# Patient Record
Sex: Male | Born: 1975 | Race: Black or African American | Hispanic: No | Marital: Single | Smoking: Never smoker
Health system: Southern US, Community
[De-identification: ages and names within clinical notes are randomized; demographics above are authoritative.]

---

## 2007-04-17 ENCOUNTER — Emergency Department (HOSPITAL_COMMUNITY): Admission: EM | Admit: 2007-04-17 | Discharge: 2007-04-17 | Payer: Self-pay | Admitting: Emergency Medicine

## 2008-10-29 ENCOUNTER — Emergency Department (HOSPITAL_BASED_OUTPATIENT_CLINIC_OR_DEPARTMENT_OTHER): Admission: EM | Admit: 2008-10-29 | Discharge: 2008-10-29 | Payer: Self-pay | Admitting: Emergency Medicine

## 2008-12-28 ENCOUNTER — Emergency Department (HOSPITAL_COMMUNITY): Admission: EM | Admit: 2008-12-28 | Discharge: 2008-12-28 | Payer: Self-pay | Admitting: Emergency Medicine

## 2009-05-17 ENCOUNTER — Emergency Department (HOSPITAL_BASED_OUTPATIENT_CLINIC_OR_DEPARTMENT_OTHER): Admission: EM | Admit: 2009-05-17 | Discharge: 2009-05-17 | Payer: Self-pay | Admitting: Emergency Medicine

## 2010-07-04 ENCOUNTER — Emergency Department (HOSPITAL_BASED_OUTPATIENT_CLINIC_OR_DEPARTMENT_OTHER): Admission: EM | Admit: 2010-07-04 | Discharge: 2010-07-04 | Payer: Self-pay | Admitting: Emergency Medicine

## 2010-07-04 ENCOUNTER — Ambulatory Visit: Payer: Self-pay | Admitting: Diagnostic Radiology

## 2011-01-10 LAB — WOUND CULTURE
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
## 2013-08-14 ENCOUNTER — Emergency Department (HOSPITAL_BASED_OUTPATIENT_CLINIC_OR_DEPARTMENT_OTHER)
Admission: EM | Admit: 2013-08-14 | Discharge: 2013-08-14 | Disposition: A | Discharge status: Home / Self Care | Payer: Self-pay | Attending: Emergency Medicine | Admitting: Emergency Medicine

## 2013-08-14 ENCOUNTER — Encounter (HOSPITAL_BASED_OUTPATIENT_CLINIC_OR_DEPARTMENT_OTHER): Payer: Self-pay | Admitting: Emergency Medicine

## 2013-08-14 DIAGNOSIS — L02419 Cutaneous abscess of limb, unspecified: Secondary | ICD-10-CM | POA: Insufficient documentation

## 2013-08-14 DIAGNOSIS — L0390 Cellulitis, unspecified: Secondary | ICD-10-CM

## 2013-08-14 DIAGNOSIS — Z87891 Personal history of nicotine dependence: Secondary | ICD-10-CM | POA: Insufficient documentation

## 2013-08-14 MED ORDER — CLINDAMYCIN HCL 150 MG PO CAPS: 450.0000 mg | ORAL_CAPSULE | Freq: Three times a day (TID) | ORAL | Status: AC

## 2013-08-14 MED ORDER — CLINDAMYCIN HCL 150 MG PO CAPS
450.0000 mg | ORAL_CAPSULE | Freq: Once | ORAL | Status: AC
  Administered 2013-08-14: 450 mg via ORAL
  Filled 2013-08-14: qty 3

## 2013-08-14 NOTE — ED Provider Notes (Signed)
CSN: [id number]     Arrival date & time 08/14/13  4098 History   First MD Initiated Contact with Patient 08/14/13 [phone]     Chief Complaint  Patient presents with  . Recurrent Skin Infections   (Consider location/radiation/quality/duration/timing/severity/associated sxs/prior Treatment) HPI  37 year old male here with the redness and pain and assumed infectious sight on his right leg for 2 days. He describes the lesion started 2 days ago after he popped a pimple. After that he began to develop pain that was followed by warmth and redness. Its progressively worsened since then prompting his coming to the ED today. He denies nausea, vomiting, fever, chills, sweats, and any pain except for localized pain. His pain is sharp and constant exacerbated by touch and pressure without any alleviating factors.    History reviewed. No pertinent past medical history. History reviewed. No pertinent past surgical history. History reviewed. No pertinent family history. History  Substance Use Topics  . Smoking status: Former Games developer  . Smokeless tobacco: Not on file  . Alcohol Use: Not on file    Review of Systems  Constitutional: Negative for fever, chills and diaphoresis.  HENT: Negative for sore throat.   Eyes: Negative for visual disturbance.  Respiratory: Negative for cough and shortness of breath.   Gastrointestinal: Negative for nausea, vomiting, abdominal pain and diarrhea.  Genitourinary: Negative for dysuria.  Musculoskeletal: Negative for back pain.  Skin: Negative for rash.  Neurological: Negative for headaches.    Allergies  Review of patient's allergies indicates no known allergies.  Home Medications   Current Outpatient Rx  Name  Route  Sig  Dispense  Refill  . clindamycin (CLEOCIN) 150 MG capsule   Oral   Take 3 capsules (450 mg total) by mouth 3 (three) times daily.   39 capsule   0    BP 139/71  Pulse 90  Temp(Src) 98.6 F (37 C) (Oral)  Resp 18  Ht 5\' 11"  (1.803  m)  Wt 250 lb (113.399 kg)  BMI 34.88 kg/m2  SpO2 99% Physical Exam  Nursing note and vitals reviewed. Constitutional: He is oriented to person, place, and time. He appears well-developed and well-nourished. No distress.  HENT:  Head: Normocephalic and atraumatic.  Eyes: EOM are normal. Pupils are equal, round, and reactive to light.  Neck: Normal range of motion. Neck supple.  Cardiovascular: Normal rate, regular rhythm and normal heart sounds.   Pulmonary/Chest: Effort normal and breath sounds normal. No respiratory distress. He has no wheezes.  Abdominal: Soft. Bowel sounds are normal. There is no tenderness.  Musculoskeletal: He exhibits no edema.  Neurological: He is alert and oriented to person, place, and time.  Skin: Skin is warm and dry. He is not diaphoretic.     6 cm by 14 cm area of induration warmth and erythema that's tender to the touch and has a centrally located 3-5 mm flesh colored papule.  No streaking away from the area of induration.   Psychiatric: He has a normal mood and affect.   Bedside US of the affected area performed without a clear pocket of fluid identified.   ED Course  Procedures (including critical care time) Labs Review Labs Reviewed - No data to display Imaging Review No results found.  EKG Interpretation   None       MDM   1. Cellulitis    37 y/o male here with cellulitis. Evaluated with bedside US and no pocket of fluid was identified to I&D. Given first dose of  clindamycin here. Without signs of systemic involvement and normal PO intake will dc home.  Discussed red flags and very low threshold for return, he voices understanding.   Will treat with 7 days of clinda, 450 mg TID Also  Discussed warm compresses TID.   Murtis Sink, MD [facility] Resident, PGY-2 08/14/2013, 9:22 AM       Elenora Gamma, MD 08/14/13 [phone]

## 2013-08-14 NOTE — ED Notes (Signed)
Pt amb to room 1 with quick steady gait in nad. Pt reports 2 days of "boil" to his right thigh area. Area is swollen, warm to touch, red. Denies any fevers.

## 2013-08-23 NOTE — ED Provider Notes (Signed)
I saw and evaluated the patient, reviewed the resident's note and I agree with the findings and plan.   .Face to face Exam:  General:  Awake HEENT:  Atraumatic Resp:  Normal effort Abd:  Nondistended Neuro:No focal weakness  Nelia Shi, MD 08/23/13 0710

## 2014-12-08 ENCOUNTER — Encounter (HOSPITAL_BASED_OUTPATIENT_CLINIC_OR_DEPARTMENT_OTHER): Payer: Self-pay | Admitting: *Deleted

## 2014-12-08 ENCOUNTER — Emergency Department (HOSPITAL_BASED_OUTPATIENT_CLINIC_OR_DEPARTMENT_OTHER)
Admission: EM | Admit: 2014-12-08 | Discharge: 2014-12-08 | Disposition: A | Discharge status: Home / Self Care | Payer: Self-pay | Attending: Emergency Medicine | Admitting: Emergency Medicine

## 2014-12-08 DIAGNOSIS — L03031 Cellulitis of right toe: Secondary | ICD-10-CM | POA: Insufficient documentation

## 2014-12-08 DIAGNOSIS — B351 Tinea unguium: Secondary | ICD-10-CM | POA: Insufficient documentation

## 2014-12-08 DIAGNOSIS — Z792 Long term (current) use of antibiotics: Secondary | ICD-10-CM | POA: Insufficient documentation

## 2014-12-08 MED ORDER — HYDROCODONE-ACETAMINOPHEN 5-325 MG PO TABS: 1.0000 | ORAL_TABLET | ORAL | Status: DC | PRN

## 2014-12-08 MED ORDER — SULFAMETHOXAZOLE-TRIMETHOPRIM 800-160 MG PO TABS: 1.0000 | ORAL_TABLET | Freq: Two times a day (BID) | ORAL | Status: AC

## 2014-12-08 NOTE — ED Notes (Signed)
Patient states he has had a problem with toenail fungus since age 39 yrs.  States last night he developed pain, warmness and pain in his right great toe.

## 2014-12-08 NOTE — Discharge Instructions (Signed)
I recommended that you soak your right foot in warm water 3-4 times a day for 15 minutes at a time. We are discharging you with antibiotics to take to help with this infection. If you notice that redness streaking up your foot or leg, you have fever, began feeling very poorly, vomiting that will not stop, please return to the hospital. We have [facility] to establish care with a primary care provider and also [facility] to see a podiatrist for your chronic fungal infection of your toenails.  You will likely need to be on oral medications to treat your fungal infection that these medications are taken long-term and need close monitoring of your liver function tests therefore this is something a primary care physician or podiatrist should prescribed.   Cellulitis Cellulitis is an infection of the skin and the tissue beneath it. The infected area is usually red and tender. Cellulitis occurs most often in the arms and lower legs.  CAUSES  Cellulitis is caused by bacteria that enter the skin through cracks or cuts in the skin. The most common types of bacteria that cause cellulitis are staphylococci and streptococci. SIGNS AND SYMPTOMS   Redness and warmth.  Swelling.  Tenderness or pain.  Fever. DIAGNOSIS  Your health care provider can usually determine what is wrong based on a physical exam. Blood tests may also be done. TREATMENT  Treatment usually involves taking an antibiotic medicine. HOME CARE INSTRUCTIONS   Take your antibiotic medicine as directed by your health care provider. Finish the antibiotic even if you start to feel better.  Keep the infected arm or leg elevated to reduce swelling.  Apply a warm cloth to the affected area up to 4 times per day to relieve pain.  Take medicines only as directed by your health care provider.  Keep all follow-up visits as directed by your health care provider. SEEK MEDICAL CARE IF:    You notice red streaks coming from the infected area.  Your red area gets larger or turns dark in color.  Your bone or joint underneath the infected area becomes painful after the skin has healed.  Your infection returns in the same area or another area.  You notice a swollen bump in the infected area.  You develop new symptoms.  You have a fever. SEEK IMMEDIATE MEDICAL CARE IF:   You feel very sleepy.  You develop vomiting or diarrhea.  You have a general ill feeling (malaise) with muscle aches and pains. MAKE SURE YOU:   Understand these instructions.  Will watch your condition.  Will get help right away if you are not doing well or get worse. Document Released: 06/21/2005 Document Revised: 01/26/2014 Document Reviewed: 11/27/2011 [facility] Patient Information 2015 [place], [place]. This information is not intended to replace advice given to you by your health care provider. Make sure you discuss any questions you have with your health care provider.  Possible Paronychia Paronychia is an inflammatory reaction involving the folds of the skin surrounding the fingernail. This is commonly caused by an infection in the skin around a nail. The most common cause of paronychia is frequent wetting of the hands (as seen with bartenders, food servers, nurses or others who wet their hands). This makes the skin around the fingernail susceptible to infection by bacteria (germs) or fungus. Other predisposing factors are:  Aggressive manicuring.  Nail biting.  Thumb sucking. The most common cause is a staphylococcal (a type of  germ) infection, or a fungal (Candida) infection. When caused by a germ, it usually comes on suddenly with redness, swelling, pus and is often painful. It may get under the nail and form an abscess (collection of pus), or form an abscess around the nail. If the nail itself is infected with a fungus, the treatment is usually prolonged and may require oral medicine for  up to one year. Your caregiver will determine the length of time treatment is required. The paronychia caused by bacteria (germs) may largely be avoided by not pulling on hangnails or picking at cuticles. When the infection occurs at the tips of the finger it is called felon. When the cause of paronychia is from the herpes simplex virus (HSV) it is called herpetic whitlow. TREATMENT  When an abscess is present treatment is often incision and drainage. This means that the abscess must be cut open so the pus can get out. When this is done, the following home care instructions should be followed. HOME CARE INSTRUCTIONS   It is important to keep the affected fingers very dry. Rubber or plastic gloves over cotton gloves should be used whenever the hand must be placed in water.  Keep wound clean, dry and dressed as suggested by your caregiver between warm soaks or warm compresses.  Soak in warm water for fifteen to twenty minutes three to four times per day for bacterial infections. Fungal infections are very difficult to treat, so often require treatment for long periods of time.  For bacterial (germ) infections take antibiotics (medicine which kill germs) as directed and finish the prescription, even if the problem appears to be solved before the medicine is gone.  Only take over-the-counter or prescription medicines for pain, discomfort, or fever as directed by your caregiver. SEEK IMMEDIATE MEDICAL CARE IF:  You have redness, swelling, or increasing pain in the wound.  You notice pus coming from the wound.  You have a fever.  You notice a bad smell coming from the wound or dressing. Document Released: 03/07/2001 Document Revised: 12/04/2011 Document Reviewed: 11/06/2008 [facility] Patient Information 2015 [place], [place]. This information is not intended to replace advice given to you by your health care provider. Make sure you discuss any questions you have with your health care provider.

## 2014-12-08 NOTE — ED Provider Notes (Signed)
TIME SEEN: 9:30 AM  CHIEF COMPLAINT: Right toe pain  HPI: Pt is a 39 y.o. male with history of toenail fungus since he was 39 years old who presents to the emergency department with swelling, redness and pain to the right great toe. Denies any known injury. States pain started last night. Denies any fevers, chills, nausea, vomiting or diarrhea. No prior history of gout. No numbness, tingling or focal weakness. No drainage. Pain worse with walking. Better with rest. Described as an aching, mild pain without radiation.  ROS: See HPI Constitutional: no fever  Eyes: no drainage  ENT: no runny nose   Cardiovascular:  no chest pain  Resp: no SOB  GI: no vomiting GU: no dysuria Integumentary: no rash  Allergy: no hives  Musculoskeletal: no leg swelling  Neurological: no slurred speech ROS otherwise negative  PAST MEDICAL HISTORY/PAST SURGICAL HISTORY:  History reviewed. No pertinent past medical history.  MEDICATIONS:  Prior to Admission medications   Medication Sig Start Date End Date Taking? Authorizing Provider  clindamycin (CLEOCIN) 150 MG capsule Take 3 capsules (450 mg total) by mouth 3 (three) times daily. 08/14/13   Elenora Gamma, MD    ALLERGIES:  No Known Allergies  SOCIAL HISTORY:  History  Substance Use Topics  . Smoking status: Never Smoker   . Smokeless tobacco: Not on file  . Alcohol Use: Yes     Comment: seldom    FAMILY HISTORY: No family history on file.  EXAM: BP 134/80 mmHg  Pulse 72  Temp(Src) 98 F (36.7 C) (Oral)  Resp 16  Ht  (1.803 m)  Wt 250 lb (113.399 kg)  BMI 34.88 kg/m2  SpO2 100% CONSTITUTIONAL: Alert and oriented and responds appropriately to questions. Well-appearing; well-nourished smiling, laughing, joking, nontoxic HEAD: Normocephalic EYES: Conjunctivae clear, PERRL ENT: normal nose; no rhinorrhea; moist mucous membranes; pharynx without lesions noted NECK: Supple, no meningismus, no LAD  CARD: RRR; S1 and S2  appreciated; no murmurs, no clicks, no rubs, no gallops RESP: Normal chest excursion without splinting or tachypnea; breath sounds clear and equal bilaterally; no wheezes, no rhonchi, no rales ABD/GI: Normal bowel sounds; non-distended; soft, non-tender, no rebound, no guarding BACK:  The back appears normal and is non-tender to palpation, there is no CVA tenderness EXT: Normal ROM in all joints; non-tender to palpation; no edema; normal capillary refill; no cyanosis; patient has onychomycosis of all of his toenails, there is some erythema around the cuticle and tenderness of the right great toe but no redness or warmth over the joints, there is no drainage from the cuticle or obvious sign of drainable paronychia, no joint effusions, compartments are all soft, 2+ DP pulses bilaterally, sensation to light touch intact diffusely    SKIN: Normal color for age and race; warm NEURO: Moves all extremities equally PSYCH: The patient's mood and manner are appropriate. Grooming and personal hygiene are appropriate.  MEDICAL DECISION MAKING: Patient here with likely early paronychia. He also has chronic onychomycosis. Have recommended that we I&D at the cuticle but patient refuses this. He would prefer being discharged on antibiotics and multiple warm soaks at home. Have recommended that if his redness, warmth increases or he has systemic symptoms that he return to the hospital. He is requesting that we give him something to treat his chronic toenail fungus. Have discussed with patient that given the extent of his own, cases he will need to be on long-term oral medications which need to be followed closely by a  primary care provider or a podiatrist. We'll give him outpatient follow-up information for both. He is neurovascularly intact distally. No signs of gout or septic arthritis. Hemodynamically stable, afebrile, well-appearing, no distress. No history of injury. I do not feel he needs imaging today. Discussed  return precautions. He verbalized understanding and is comfortable with this plan. We'll discharge home on Bactrim and with pain medication. Will provide work note.      Layla MawKristen N Danuel Felicetti, DO 12/08/14 [phone]

## 2015-06-26 ENCOUNTER — Encounter (HOSPITAL_BASED_OUTPATIENT_CLINIC_OR_DEPARTMENT_OTHER): Payer: Self-pay | Admitting: Emergency Medicine

## 2015-06-26 ENCOUNTER — Emergency Department (HOSPITAL_BASED_OUTPATIENT_CLINIC_OR_DEPARTMENT_OTHER)
Admission: EM | Admit: 2015-06-26 | Discharge: 2015-06-26 | Disposition: A | Discharge status: Home / Self Care | Payer: Self-pay | Attending: Emergency Medicine | Admitting: Emergency Medicine

## 2015-06-26 DIAGNOSIS — L03113 Cellulitis of right upper limb: Secondary | ICD-10-CM | POA: Insufficient documentation

## 2015-06-26 DIAGNOSIS — Z792 Long term (current) use of antibiotics: Secondary | ICD-10-CM | POA: Insufficient documentation

## 2015-06-26 MED ORDER — CEPHALEXIN 500 MG PO CAPS: 500.0000 mg | ORAL_CAPSULE | Freq: Three times a day (TID) | ORAL | Status: AC

## 2015-06-26 MED ORDER — SULFAMETHOXAZOLE-TRIMETHOPRIM 800-160 MG PO TABS: 1.0000 | ORAL_TABLET | Freq: Two times a day (BID) | ORAL | Status: AC
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
## 2015-06-26 NOTE — ED Notes (Addendum)
Area marked with skin marker, at most outer perimeter. 4mm x 3mm in size

## 2015-06-26 NOTE — ED Provider Notes (Signed)
CSN: [id number]     Arrival date & time 06/26/15  1531 History  By signing my name below, I, Phillis Haggis, attest that this documentation has been prepared under the direction and in the presence of Elwin Mocha, MD. Electronically Signed: Phillis Haggis, ED Scribe. 06/26/2015. 5:36 PM.   Chief Complaint  Patient presents with  . Insect Bite   Patient is a 39 y.o. male presenting with animal bite. The history is provided by the patient. No language interpreter was used.  Animal Bite Contact animal:  Insect Location:  Shoulder/arm Shoulder/arm injury location:  R forearm Time since incident:  4 days Pain details:    Quality:  Sore   Severity:  Mild   Timing:  Constant   Progression:  Worsening Incident location:  Outside Provoked: unprovoked   Notifications:  None Animal in possession: no   Ineffective treatments: neosporin. Associated symptoms: rash   Associated symptoms: no fever   HPI Comments: Ryan Jacobs is a 39 y.o. male who presents to the Emergency Department complaining of insect bite to the right forearm onset 4 days ago. Pt states that he believes that he got bit by an insect on Wednesday. He states a scab formed on the area, and he pulled it off. Pt reports soreness to the area and clear drainage. Reports using Neosporin on the area to no relief. Denies fever, nausea, or vomiting. Denies allergies to medication.   History reviewed. No pertinent past medical history. History reviewed. No pertinent past surgical history. History reviewed. No pertinent family history. Social History  Substance Use Topics  . Smoking status: Never Smoker   . Smokeless tobacco: None  . Alcohol Use: Yes     Comment: seldom    Review of Systems  Constitutional: Negative for fever.  Gastrointestinal: Negative for nausea and vomiting.  Skin: Positive for rash.  All other systems reviewed and are negative.  Allergies  Review of patient's allergies indicates no known  allergies.  Home Medications   Prior to Admission medications   Medication Sig Start Date End Date Taking? Authorizing Provider  neomycin-bacitracin-polymyxin (NEOSPORIN) ointment Apply 1 application topically every 12 (twelve) hours. apply to eye   Yes Historical Provider, MD  clindamycin (CLEOCIN) 150 MG capsule Take 3 capsules (450 mg total) by mouth 3 (three) times daily. 08/14/13   Elenora Gamma, MD  HYDROcodone-acetaminophen (NORCO/VICODIN) 5-325 MG per tablet Take 1 tablet by mouth every 4 (four) hours as needed. 12/08/14   Kristen N Ward, DO   BP 128/78 mmHg  Pulse 55  Temp(Src) 98.3 F (36.8 C) (Oral)  Resp 20  Ht  (1.803 m)  Wt 255 lb (115.667 kg)  BMI 35.58 kg/m2  SpO2 100% Physical Exam  Constitutional: He is oriented to person, place, and time. He appears well-developed and well-nourished. No distress.  HENT:  Head: Normocephalic and atraumatic.  Mouth/Throat: No oropharyngeal exudate.  Eyes: EOM are normal. Pupils are equal, round, and reactive to light.  Neck: Normal range of motion. Neck supple.  Cardiovascular: Normal rate and regular rhythm.  Exam reveals no friction rub.   No murmur heard. Pulmonary/Chest: Effort normal and breath sounds normal. No respiratory distress. He has no wheezes. He has no rales.  Abdominal: He exhibits no distension. There is no tenderness. There is no rebound.  Musculoskeletal: Normal range of motion. He exhibits no edema.       Arms: Neurological: He is alert and oriented to person, place, and time.  Skin: He is not  diaphoretic.  Nursing note and vitals reviewed.   ED Course  Procedures (including critical care time) DIAGNOSTIC STUDIES: Oxygen Saturation is 100% on RA ,normal by my interpretation.    COORDINATION OF CARE: 5:35 PM-Discussed treatment plan which includes Bactrim and Keflex with pt at bedside and pt agreed to plan.   Labs Review Labs Reviewed - No data to display  Imaging Review No results  found.     EKG Interpretation None      MDM   Final diagnoses:  Right forearm cellulitis    33M with small area of R forearm cellulitis. No abscess. No systemic symptoms. Given bactrim/keflex.    I personally performed the services described in this documentation, which was scribed in my presence. The recorded information has been reviewed and is accurate.      Elwin Mocha, MD 06/27/15 [phone]

## 2015-06-26 NOTE — ED Notes (Addendum)
Presents with possible insect bite to posterior area of rt forearm, states a scab came off today, area appears round, red with skin layer missing, surrounding this area is also an area that is "pear shaped" with small raised areas. Complains of soreness at area. No fevers noted

## 2015-06-26 NOTE — Discharge Instructions (Signed)

## 2022-06-22 ENCOUNTER — Ambulatory Visit (INDEPENDENT_AMBULATORY_CARE_PROVIDER_SITE_OTHER): Payer: 59

## 2022-06-22 ENCOUNTER — Ambulatory Visit (INDEPENDENT_AMBULATORY_CARE_PROVIDER_SITE_OTHER): Payer: 59 | Admitting: Podiatry

## 2022-06-22 DIAGNOSIS — M7751 Other enthesopathy of right foot: Secondary | ICD-10-CM | POA: Diagnosis not present

## 2022-06-22 DIAGNOSIS — M7750 Other enthesopathy of unspecified foot: Secondary | ICD-10-CM

## 2022-06-22 DIAGNOSIS — M7661 Achilles tendinitis, right leg: Secondary | ICD-10-CM | POA: Diagnosis not present

## 2022-06-22 DIAGNOSIS — M7731 Calcaneal spur, right foot: Secondary | ICD-10-CM

## 2022-06-22 MED ORDER — MELOXICAM 15 MG PO TABS: 15.0000 mg | ORAL_TABLET | Freq: Every day | ORAL | 1 refills | Status: DC | PRN

## 2022-06-22 NOTE — Progress Notes (Signed)
Subjective:   Patient ID: Ryan Jacobs, male   DOB: 46 y.o.   MRN: [id number]   HPI Chief Complaint  Patient presents with   Foot Problem    Bone spur of right foot- about 3 years ago.     46 y.o. male with the above concerns. He states that he gets a lot of pain, mostly in the morning. He has to stretch and get it worked out then he walk without a limpt his has been ongoing for 5-6 years ago. It will go away at times but then it comes back. He went to the beach about 2 months ago and it flared up. He was running on the beach. Occaional swelling but he states it has gone down. No numbness or tingling. No other treatment.   No past medical history on file.  No past surgical history on file.   Current Outpatient Medications:    cephALEXin (KEFLEX) 500 MG capsule, Take 1 capsule (500 mg total) by mouth 3 (three) times daily., Disp: 30 capsule, Rfl: 0   clindamycin (CLEOCIN) 150 MG capsule, Take 3 capsules (450 mg total) by mouth 3 (three) times daily., Disp: 39 capsule, Rfl: 0   HYDROcodone-acetaminophen (NORCO/VICODIN) 5-325 MG per tablet, Take 1 tablet by mouth every 4 (four) hours as needed., Disp: 15 tablet, Rfl: 0   neomycin-bacitracin-polymyxin (NEOSPORIN) ointment, Apply 1 application topically every 12 (twelve) hours. apply to eye, Disp: , Rfl:   No Known Allergies   He does not smoke.   Review of Systems  All other systems reviewed and are negative.  No past medical history on file.  No past surgical history on file.   Current Outpatient Medications:    meloxicam (MOBIC) 15 MG tablet, Take 1 tablet (15 mg total) by mouth daily as needed for pain., Disp: 30 tablet, Rfl: 1   cephALEXin (KEFLEX) 500 MG capsule, Take 1 capsule (500 mg total) by mouth 3 (three) times daily., Disp: 30 capsule, Rfl: 0   clindamycin (CLEOCIN) 150 MG capsule, Take 3 capsules (450 mg total) by mouth 3 (three) times daily., Disp: 39 capsule, Rfl: 0   HYDROcodone-acetaminophen (NORCO/VICODIN)  5-325 MG per tablet, Take 1 tablet by mouth every 4 (four) hours as needed., Disp: 15 tablet, Rfl: 0   neomycin-bacitracin-polymyxin (NEOSPORIN) ointment, Apply 1 application topically every 12 (twelve) hours. apply to eye, Disp: , Rfl:   No Known Allergies         Objective:  Physical Exam  General: AAO x3, NAD  Dermatological: Skin is warm, dry and supple bilateral. There are no open sores, no preulcerative lesions, no rash or signs of infection present.  Vascular: Dorsalis Pedis artery and Posterior Tibial artery pedal pulses are 2/4 bilateral with immedate capillary fill time.  There is no pain with calf compression, swelling, warmth, erythema.   Neruologic: Grossly intact via light touch bilateral.  Negative Tinel sign.  Musculoskeletal: Tenderness palpation of the posterior aspect the calcaneus a lot.  Prominent bone spur as well as the distal portion Achilles tendon on the insertion.  There is no pain with lateral compression of calcaneus.  No edema, erythema.  Equinus present.  Muscular strength 5/5 in all groups tested bilateral.  Gait: Unassisted, Nonantalgic.       Assessment:   Posterior calcaneal spurring, insertional Achilles tendinitis.     Plan:  -Treatment options discussed including all alternatives, risks, and complications -Etiology of symptoms were discussed -X-rays were obtained and reviewed with the patient.  Calcaneal spurring  is present as well as calcifications noted along the Achilles tendon distally.  No evidence of acute fracture.  Decreased calcaneal rotation angle. -Discussed both conservative as well as surgical treatment options. -We discussed stretching, icing exercises daily. -He will let us dispensed -A night splint was dispensed for stretching the Achilles tendon. -We discussed shoe modifications and orthotics. -He is interested in FMLA.  We can do intermittent FMLA.  Trula Slade DPM

## 2022-06-22 NOTE — Patient Instructions (Signed)

## 2022-07-03 DIAGNOSIS — M79676 Pain in unspecified toe(s): Secondary | ICD-10-CM

## 2022-07-12 ENCOUNTER — Other Ambulatory Visit: Payer: Self-pay | Admitting: Podiatry

## 2022-07-12 ENCOUNTER — Telehealth: Payer: Self-pay | Admitting: Podiatry

## 2022-07-12 MED ORDER — MELOXICAM 15 MG PO TABS: 15.0000 mg | ORAL_TABLET | Freq: Every day | ORAL | 1 refills | Status: DC | PRN

## 2022-07-12 NOTE — Telephone Encounter (Signed)
Patient said the previous pharmacy the medication was sent to, has not been opened. He would like the medication sent to ([facility] [address] [place], [place] [address]) phone number [phone]

## 2022-07-20 ENCOUNTER — Telehealth: Payer: Self-pay | Admitting: Podiatry

## 2022-07-20 NOTE — Telephone Encounter (Signed)
Mr. Ryan Jacobs stated that he still having a lot of pain. He is currently on intermittent FMLA but he wants to know if you can just take him out of work? When hes having a lot of pain, hes out of work with no pay, so he wanted to let his feet rest for a while.

## 2022-07-24 ENCOUNTER — Encounter: Payer: Self-pay | Admitting: Podiatry

## 2022-07-31 ENCOUNTER — Ambulatory Visit (INDEPENDENT_AMBULATORY_CARE_PROVIDER_SITE_OTHER): Payer: 59 | Admitting: Podiatry

## 2022-07-31 DIAGNOSIS — M7661 Achilles tendinitis, right leg: Secondary | ICD-10-CM

## 2022-07-31 MED ORDER — MELOXICAM 15 MG PO TABS: 15.0000 mg | ORAL_TABLET | Freq: Every day | ORAL | 1 refills | Status: AC | PRN

## 2022-07-31 NOTE — Progress Notes (Signed)
Subjective: Chief Complaint  Patient presents with   Heel Spurs    Right foot heel spur, Burning, patient is till having pain, Rate of pain 8 out of 10, TX: stretching,icing and night splint    47 year old male presents with above concerns.  The "buldge" has gone down but he is getting burning now.  No recent changes otherwise.  Objective: AAO x3, NAD DP/PT pulses palpable bilaterally, CRT less than 3 seconds There is still tenderness palpation on the posterior aspect of calcaneus on the insertion Achilles tendon.  Heel spurs also present and palpable.  No pain with compression of calcaneus.  There is a negative Valsalva describing burning sensation to the heel.  MMT 5/5 No pain with calf compression, swelling, warmth, erythema  Assessment: 46 year old male with heel spur, insertional Achilles tendinitis  Plan: -All treatment options discussed with the patient including all alternatives, risks, complications.  -We discussed both conservative as well as surgical treatment options. -At this time given his ongoing symptoms we will order MRI of the right ankle. -[facility]erral to [facility].  Include dry needling. -Continue shoes and good arch support as well as offloading. -Patient encouraged to call the office with any questions, concerns, change in symptoms.   Ryan Jacobs DPM

## 2022-08-21 ENCOUNTER — Other Ambulatory Visit: Payer: 59

## 2022-08-25 ENCOUNTER — Ambulatory Visit
Admission: RE | Admit: 2022-08-25 | Discharge: 2022-08-25 | Disposition: A | Discharge status: Home / Self Care | Payer: 59 | Source: Ambulatory Visit | Attending: Podiatry | Admitting: Podiatry

## 2022-08-25 DIAGNOSIS — M7661 Achilles tendinitis, right leg: Secondary | ICD-10-CM

## 2022-09-11 ENCOUNTER — Ambulatory Visit (INDEPENDENT_AMBULATORY_CARE_PROVIDER_SITE_OTHER): Payer: 59 | Admitting: Podiatry

## 2022-09-11 DIAGNOSIS — M7731 Calcaneal spur, right foot: Secondary | ICD-10-CM

## 2022-09-11 DIAGNOSIS — M7661 Achilles tendinitis, right leg: Secondary | ICD-10-CM

## 2022-09-11 NOTE — Progress Notes (Unsigned)
Subjective: Chief Complaint  Patient presents with   Foot Pain    Right leg, MRI       46 year old male presents with above concerns.  He is doing physical therapy.  Overall he feels his symptoms about the same but he states he also needs to get back to work.  No recent injury or change otherwise.  Objective: AAO x3, NAD DP/PT pulses palpable bilaterally, CRT less than 3 seconds There is still tenderness palpation on the posterior aspect of calcaneus on the insertion Achilles tendon.  Heel spurs also present and palpable.  No pain with compression of calcaneus.  There is a negative Tinel sign.  MMT 5/5.  Overall symptoms are unchanged.  There is no ankle instability noted. No pain with calf compression, swelling, warmth, erythema  Assessment: 46 year old male with heel spur, insertional Achilles tendinitis  Plan: -All treatment options discussed with the patient including all alternatives, risks, complications.  -I reviewed the MRI with him.  Discussed with conservative as well as surgical treatment options.  He can continue with conservative care for now but will consider surgery next year if symptoms persist.  He is in continue with physical therapy, good supportive shoes, anti-inflammatories as needed.  He will do intermittent FMLA and also with restrictions to sit at work.  Will release him to go back to work with the restrictions of sitting.  If symptoms persist discussed with him surgical intervention in the spring or whenever he would like to schedule if needed.  Vivi Barrack DPM

## 2022-09-27 ENCOUNTER — Encounter: Payer: Self-pay | Admitting: Podiatry

## 2022-09-27 ENCOUNTER — Telehealth: Payer: Self-pay | Admitting: Podiatry

## 2022-09-27 NOTE — Telephone Encounter (Signed)
Good Morning, Ryan Jacobs would like intermittent FMLA leave, just in case. He had to return to work due to finances.

## 2022-11-24 ENCOUNTER — Ambulatory Visit: Payer: 59 | Admitting: Podiatry

## 2023-01-26 ENCOUNTER — Ambulatory Visit (INDEPENDENT_AMBULATORY_CARE_PROVIDER_SITE_OTHER): Payer: BC Managed Care – PPO | Admitting: Podiatry

## 2023-01-26 ENCOUNTER — Ambulatory Visit (INDEPENDENT_AMBULATORY_CARE_PROVIDER_SITE_OTHER): Payer: BC Managed Care – PPO

## 2023-01-26 ENCOUNTER — Encounter: Payer: Self-pay | Admitting: Podiatry

## 2023-01-26 ENCOUNTER — Ambulatory Visit: Payer: 59 | Admitting: Podiatry

## 2023-01-26 DIAGNOSIS — M79671 Pain in right foot: Secondary | ICD-10-CM

## 2023-01-26 DIAGNOSIS — S86011A Strain of right Achilles tendon, initial encounter: Secondary | ICD-10-CM

## 2023-01-26 NOTE — Progress Notes (Signed)
   Chief Complaint  Patient presents with   Foot Pain    Patient came in today for a injured right foot sunday and having swelling and pain( when injured he heard a pop) rate of pain 8 out of 10, back of the heel, X-Rays done today     HPI: 47 y.o. male PMHx Achilles tendinitis/tendinosis of the right lower extremity with posterior heel spur and calcifications presenting today for an acute injury that the patient sustained this past Sunday, 01/21/2023.  Patient states that he was playing with his children and was running when he felt and heard an audible pop to the posterior aspect of the right ankle.  He immediately fell down and was unable to push off of his right lower extremity.  He has been having pain and tenderness ever since.  Currently has not done anything for treatment.  Last seen in the office 09/27/2022 with Dr. Ardelle Anton  No past medical history on file.  No past surgical history on file.  No Known Allergies   Physical Exam: General: The patient is alert and oriented x3 in no acute distress.  Dermatology: Skin is warm, dry and supple bilateral lower extremities.   Vascular: Palpable pedal pulses bilaterally. Capillary refill within normal limits.  Ecchymosis with edema noted around the foot and ankle.  Neurological: Grossly intact via light touch  Musculoskeletal Exam: Loss of plantarflexion against resistance right lower extremity.  Palpable dell with positive Thompson test noted.  Negative plantarflexion with calf squeeze.  Diffuse edema with ecchymosis also noted.  Findings consistent with complete Achilles tendon rupture  Radiographic Exam RT foot and ankle 01/26/2023:  Normal osseous mineralization.  No acute fracture identified.  Loss of integrity of Kager's triangle.  Compared to prior x-rays, calcifications at the insertion of the Achilles tendon are now retracted approximately 5 cm.  Findings are consistent with acute rupture.  Posterior heel spur also  noted  Assessment/Plan of Care: 1.  Achilles rupture right lower extremity with intrasubstance calcification 2.  Posterior heel spur right  -Patient evaluated.  X-rays reviewed.  Discussed the findings of likely complete Achilles tendon rupture of the right lower extremity.  Discussed different treatment options and surgery was recommended.  Risk benefits advantages and disadvantages were explained in detail to the patient including the postoperative recovery course.  No guarantees were expressed or implied.  All patient questions answered. -Authorization for surgery was initiated today.  Surgery will consist of posterior heel spur resection right.  Open repair of acute Achilles tendon rupture right. -MRI ordered RT ankle.  Prior to surgery I would like to have MRI completed to assist with surgical planning -Return to clinic 1 week postop  *Planning to take 1 week off of work.  Understands that he will be strictly nonweightbearing for approximately 8 weeks postop.  Patient states that he is able to use a knee scooter postoperatively at work       Felecia Shelling, DPM [facility]  Dr. Felecia Shelling, DPM    [address] [place], [place] [address]                Office [phone]  Fax [phone]

## 2023-01-30 ENCOUNTER — Telehealth: Payer: Self-pay | Admitting: Urology

## 2023-01-30 NOTE — Telephone Encounter (Signed)
DOS - 02/22/23  OPEN REPAIR ACHILLES RUPTURE RIGHT --- 14782 CALCANEAL OSTEOTOMY RIGHT --- 95621 TENOLYSIS RIGHT --- 30865 TRANSFER TENDON RIGHT --- 78469  [employer] EFFECTIVE DATE - 01/01/23  DEDUCTIBLE - $350.00 W/ $350.00 REMAINING OOP - $2,100.00 W/ $2,100.00 REMAINING COINSURANCE - 0%  SPOKE WITH DIVINE F. WITH [employer] AND SHE STATED THAT FOR CPT CODES 62952, [phone], [phone] AND [phone] NO PRIOR AUTH IS REQUIRED.  CALL REF # I - [id number]

## 2023-02-05 ENCOUNTER — Encounter: Payer: Self-pay | Admitting: Podiatry

## 2023-02-06 ENCOUNTER — Other Ambulatory Visit: Payer: Self-pay | Admitting: Podiatry

## 2023-02-06 ENCOUNTER — Ambulatory Visit
Admission: RE | Admit: 2023-02-06 | Discharge: 2023-02-06 | Disposition: A | Discharge status: Home / Self Care | Payer: BC Managed Care – PPO | Source: Ambulatory Visit | Attending: Podiatry | Admitting: Podiatry

## 2023-02-06 DIAGNOSIS — S86011A Strain of right Achilles tendon, initial encounter: Secondary | ICD-10-CM

## 2023-02-06 DIAGNOSIS — M79671 Pain in right foot: Secondary | ICD-10-CM

## 2023-02-22 ENCOUNTER — Other Ambulatory Visit: Payer: Self-pay | Admitting: Podiatry

## 2023-02-22 DIAGNOSIS — S86011A Strain of right Achilles tendon, initial encounter: Secondary | ICD-10-CM | POA: Diagnosis not present

## 2023-02-22 MED ORDER — OXYCODONE-ACETAMINOPHEN 5-325 MG PO TABS: 1.0000 | ORAL_TABLET | ORAL | 0 refills | Status: DC | PRN

## 2023-02-22 MED ORDER — IBUPROFEN 800 MG PO TABS: 800.0000 mg | ORAL_TABLET | Freq: Three times a day (TID) | ORAL | 1 refills | Status: DC

## 2023-02-22 NOTE — Progress Notes (Signed)
PRN postop 

## 2023-02-28 ENCOUNTER — Ambulatory Visit (INDEPENDENT_AMBULATORY_CARE_PROVIDER_SITE_OTHER): Payer: BC Managed Care – PPO

## 2023-02-28 ENCOUNTER — Ambulatory Visit (INDEPENDENT_AMBULATORY_CARE_PROVIDER_SITE_OTHER): Payer: BC Managed Care – PPO | Admitting: Podiatry

## 2023-02-28 DIAGNOSIS — Z9889 Other specified postprocedural states: Secondary | ICD-10-CM

## 2023-03-07 ENCOUNTER — Ambulatory Visit (INDEPENDENT_AMBULATORY_CARE_PROVIDER_SITE_OTHER): Payer: BC Managed Care – PPO | Admitting: Podiatry

## 2023-03-07 DIAGNOSIS — Z9889 Other specified postprocedural states: Secondary | ICD-10-CM | POA: Diagnosis not present

## 2023-03-07 DIAGNOSIS — S86011A Strain of right Achilles tendon, initial encounter: Secondary | ICD-10-CM | POA: Diagnosis not present

## 2023-03-07 MED ORDER — OXYCODONE-ACETAMINOPHEN 5-325 MG PO TABS: 1.0000 | ORAL_TABLET | ORAL | 0 refills | Status: DC | PRN

## 2023-03-07 NOTE — Progress Notes (Signed)
   Chief Complaint  Patient presents with   Foot Pain    POV #2 DOS 02/22/2023 POSTERIOR HEEL SPUR RESECTION RT, OPEN REPAIR ACHILLES RUPTURE RT, POSS FLEXOR TENDON TRANSFER RT    Subjective:  Patient presents today status post Achilles tendon repair with posterior heel spur resection right.  DOS: 02/22/2023.  Patient states that the cast is rubbing his foot and leg and causing a lot of irritation.  He has been NWB in the cast with the knee scooter.  No past medical history on file.  No past surgical history on file.  No Known Allergies  Objective/Physical Exam Cast removed today.  Neurovascular status intact.  Incision well coapted with sutures and staples intact. No sign of infectious process noted. No dehiscence. No active bleeding noted.  Minimal edema.  Calf is soft and supple and nontender with palpation  Assessment: 1. s/p open repair of Achilles tendon rupture right with posterior heel spur resection. DOS: 02/22/2023   Plan of Care:  -Patient was evaluated.  -Cast was bivalved and removed today. -Dressings applied. -Cam boot with a heel lift was applied to the extremity.  Continue NWB in the knee scooter -Refill prescription for Percocet 5/325 mg #30 -Return to clinic 2 weeks suture and staple removal and follow-up x-rays  Felecia Shelling, DPM [facility]  Dr. Felecia Shelling, DPM    [address] [place], [place] [address]                Office [phone]  Fax [phone]

## 2023-03-07 NOTE — Progress Notes (Signed)
   Chief Complaint  Patient presents with   Routine Post Op    POV #1 DOS 02/22/2023 POSTERIOR HEEL SPUR RESECTION RT, OPEN REPAIR ACHILLES RUPTURE RT, POSS FLEXOR TENDON TRANSFER RT Patient denies any N/V/F/C/SOB,     Subjective:  Patient presents today status post open repair of acute Achilles tendon rupture with posterior heel spur resection right.  DOS: 02/22/2023.  Patient doing well.  He does have some very slight irritation with the cast but it is tolerable.  He has been NWB in the cast using the knee scooter.  No new complaints  No past medical history on file.  No past surgical history on file.  No Known Allergies  Objective/Physical Exam Neurovascular status intact.  Cast left intact today.  Patient able to wiggle his toes.  Capillary refill immediate.  Sensation of the toes intact.  There is some slight superficial abrasion noted to the anterior shin at the most proximal portion of the calf secondary to pressure applied when using the knee scooter.  Radiographic Exam:  Deferred since the cast is left intact today  Assessment: 1. s/p open repair of acute Achilles tendon rupture with posterior heel spur resection right. DOS: 02/22/2023   Plan of Care:  -Patient was evaluated.  -Cast left intact today.  Continue NWB using the knee scooter -Refill prescription Percocet 5/325 mg -Return to clinic 1 week  Felecia Shelling, DPM [facility]  Dr. Felecia Shelling, DPM    [address] [place], [place] [address]                Office [phone]  Fax [phone]

## 2023-03-19 ENCOUNTER — Ambulatory Visit (INDEPENDENT_AMBULATORY_CARE_PROVIDER_SITE_OTHER): Payer: BC Managed Care – PPO

## 2023-03-19 ENCOUNTER — Ambulatory Visit (INDEPENDENT_AMBULATORY_CARE_PROVIDER_SITE_OTHER): Payer: BC Managed Care – PPO | Admitting: Podiatry

## 2023-03-19 DIAGNOSIS — M79671 Pain in right foot: Secondary | ICD-10-CM

## 2023-03-19 DIAGNOSIS — S86011A Strain of right Achilles tendon, initial encounter: Secondary | ICD-10-CM

## 2023-03-19 MED ORDER — OXYCODONE-ACETAMINOPHEN 5-325 MG PO TABS: 1.0000 | ORAL_TABLET | ORAL | 0 refills | Status: DC | PRN

## 2023-03-19 MED ORDER — IBUPROFEN 800 MG PO TABS: 800.0000 mg | ORAL_TABLET | Freq: Three times a day (TID) | ORAL | 1 refills | Status: DC

## 2023-03-19 MED ORDER — SILVER SULFADIAZINE 1 % EX CREA: TOPICAL_CREAM | CUTANEOUS | 1 refills | Status: AC

## 2023-03-19 NOTE — Progress Notes (Signed)
   Chief Complaint  Patient presents with   Plantar Fasciitis    POV #3 DOS 02/22/2023 POSTERIOR HEEL SPUR RESECTION RT, OPEN REPAIR ACHILLES RUPTURE RT, POSS FLEXOR TENDON TRANSFER RT    Subjective:  Patient presents today status post Achilles tendon repair with posterior heel spur resection right.  DOS: 02/22/2023.  Patient continues to be nonweightbearing in the cam boot with the knee scooter.  Overall he is doing much better  No past medical history on file.  No past surgical history on file.  No Known Allergies  Objective/Physical Exam Neurovascular status intact.  Incision well coapted with sutures and staples intact. No sign of infectious process noted. No dehiscence. No active bleeding noted.  Minimal edema.  Calf is soft and supple and nontender with palpation  Assessment: 1. s/p open repair of Achilles tendon rupture right with posterior heel spur resection. DOS: 02/22/2023   Plan of Care:  -Patient was evaluated.  - Sutures and staples removed today -Continue NWB in the cam boot for an additional 2 weeks.  After 2 weeks the patient may begin to transition into weightbearing in the cam boot -Okay for passive range of motion exercises which were demonstrated today -Return to clinic in 4 weeks.  At this time we will initiate physical therapy  Felecia Shelling, DPM [facility]  Dr. Felecia Shelling, DPM    [address] [place], [place] [address]                Office [phone]  Fax [phone]

## 2023-03-21 ENCOUNTER — Encounter: Payer: BC Managed Care – PPO | Admitting: Podiatry

## 2023-04-18 ENCOUNTER — Encounter: Payer: BC Managed Care – PPO | Admitting: Podiatry

## 2023-04-19 ENCOUNTER — Telehealth: Payer: Self-pay | Admitting: Podiatry

## 2023-04-19 NOTE — Telephone Encounter (Signed)
Pt called and would like you to call him. He missed his appt yesterday because he got his days mixed up. He is rescheduled but the next available was not until August 14th with you and I did add to waitlist. He said you and him had a plan and he was to start PT the first week in August.He ask for you to please call him.

## 2023-04-23 ENCOUNTER — Encounter: Payer: Self-pay | Admitting: Podiatry

## 2023-05-09 ENCOUNTER — Ambulatory Visit (INDEPENDENT_AMBULATORY_CARE_PROVIDER_SITE_OTHER): Payer: BC Managed Care – PPO | Admitting: Podiatry

## 2023-05-09 DIAGNOSIS — S86011A Strain of right Achilles tendon, initial encounter: Secondary | ICD-10-CM

## 2023-05-09 MED ORDER — TERBINAFINE HCL 250 MG PO TABS: 250.0000 mg | ORAL_TABLET | Freq: Every day | ORAL | 1 refills | Status: AC

## 2023-05-09 MED ORDER — OXYCODONE-ACETAMINOPHEN 5-325 MG PO TABS: 1.0000 | ORAL_TABLET | Freq: Three times a day (TID) | ORAL | 0 refills | Status: AC | PRN

## 2023-05-09 NOTE — Progress Notes (Signed)
   Chief Complaint  Patient presents with   Routine Post Op    POV#4 Status post repair ruptured achilles tendon Right- DOS 02/22/23.  .  Just started PT last week for an initial eval.  No calf pain or tenderness. No N/V/F/C/NS.      Subjective:  Patient presents today status post Achilles tendon repair with posterior heel spur resection right.  DOS: 02/22/2023.  Patient is been weightbearing as tolerated in the cam boot.  He just began physical therapy.  Overall he says he is improving.  He notices significant weakness with moderate tenderness. Patient also has new complaint today regarding thick discoloration of the toenails bilateral.  He says that this has been present for several years.  He has not anything for treatment.  He is requesting to have them evaluated and requesting to discuss different antifungal treatment with allergies  No past medical history on file.  No past surgical history on file.  No Known Allergies  Objective/Physical Exam Neurovascular status intact.  Incision nicely healed.  Range of motion WNL to the ankle joint.  Muscle strength 5/5 all compartments.  Calf is soft and supple and nontender. Hyperkeratotic dystrophic nails noted 1-5 bilateral with thickening consistent with fungal nail infection  Assessment: 1. s/p open repair of Achilles tendon rupture right with posterior heel spur resection. DOS: 02/22/2023 -Patient was evaluated.  -Continue physical therapy at PT in [place].   -Patient may transition out of the cam boot into good supportive tennis     shoes and sneakers  -Light resistance band training and strengthening   -Gait training  -Increased range of motion exercises to the ankle and Achilles -Continue Percocet as needed pain.  Refill provided -Return to clinic 6 weeks  2.  Onychomycosis of toenails bilateral/fungal nail infection -Today we discussed different treatment options including topical and oral antifungal treatment modalities.   Relative efficacies and risks and benefits associated with each modality were explained.  Patient opts for oral antifungal treatment -Prescription for Lamisil 250 mg #90 daily.  Denies a history of liver pathology or symptoms     Felecia Shelling, DPM [facility]  Dr. Felecia Shelling, DPM    [address] [place], [place] [address]                Office [phone]  Fax [phone]

## 2023-05-10 ENCOUNTER — Telehealth: Payer: Self-pay | Admitting: Podiatry

## 2023-05-10 NOTE — Telephone Encounter (Signed)
[facility] Ryan Jacobs needs a post op instruction for therapy faxed to them. Phone numbe is [phone] and fzx number is [phone]

## 2023-06-20 ENCOUNTER — Ambulatory Visit (INDEPENDENT_AMBULATORY_CARE_PROVIDER_SITE_OTHER): Payer: BC Managed Care – PPO | Admitting: Podiatry

## 2023-06-20 ENCOUNTER — Encounter: Payer: Self-pay | Admitting: Podiatry

## 2023-06-20 VITALS — Ht 71.0 in | Wt 255.0 lb

## 2023-06-20 DIAGNOSIS — S86011A Strain of right Achilles tendon, initial encounter: Secondary | ICD-10-CM | POA: Diagnosis not present

## 2023-06-20 MED ORDER — IBUPROFEN 800 MG PO TABS: 800.0000 mg | ORAL_TABLET | Freq: Three times a day (TID) | ORAL | 1 refills | Status: AC

## 2023-06-20 NOTE — Progress Notes (Signed)
   Chief Complaint  Patient presents with   Routine Post Op    POV#5: Status post repair ruptured achilles tendon Right- DOS 02/22/23.  . States feeling better has been doing pt now for a while    Subjective:  Patient presents today status post Achilles tendon repair with posterior heel spur resection right.  DOS: 02/22/2023.  Patient states that he is doing very well.  He is basically full activity with no restrictions.  Physical therapy has been going well.  He only has mild achiness occasionally depending on activity and he takes ibuprofen as needed.  Overall very satisfied.  History reviewed. No pertinent past medical history.  History reviewed. No pertinent surgical history.  No Known Allergies  Objective/Physical Exam Neurovascular status intact.  Incision nicely healed.  Range of motion WNL to the ankle joint.  Muscle strength 5/5 all compartments.  Calf is soft and supple and nontender. Hyperkeratotic dystrophic nails noted 1-5 bilateral with thickening consistent with fungal nail infection  Assessment: 1. s/p open repair of Achilles tendon rupture right with posterior heel spur resection. DOS: 02/22/2023 -Patient was evaluated.  - Patient may continue PT in [place] as needed -From a surgical standpoint patient may slowly increase to full activity no restrictions.  Patient states that essentially he is already full activity with no restrictions -Note was provided for the patient today.  Patient may return to work 06/21/2023 full activity no restrictions  2.  Onychomycosis of toenails bilateral/fungal nail infection - Continue Lamisil 2 and 50 mg #90 daily until completed -Return to clinic as needed     Felecia Shelling, DPM [facility]  Dr. Felecia Shelling, DPM    [address] [place], [place] [address]                Office [phone]  Fax [phone]
